# Patient Record
Sex: Male | Born: 2000 | Race: Black or African American | Hispanic: No | Marital: Single | Smoking: Never smoker
Health system: Southern US, Community
[De-identification: ages and names within clinical notes are randomized; demographics above are authoritative.]

---
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
## 2020-05-03 ENCOUNTER — Ambulatory Visit: Payer: Medicaid Other | Admitting: Neurology

## 2020-05-04 ENCOUNTER — Ambulatory Visit: Payer: Medicaid Other | Admitting: Neurology

## 2020-05-04 ENCOUNTER — Encounter: Payer: Self-pay | Admitting: Neurology

## 2020-05-04 ENCOUNTER — Telehealth: Payer: Self-pay | Admitting: *Deleted

## 2020-05-04 VITALS — BP 105/70 | HR 92 | Ht 70.0 in | Wt 143.0 lb

## 2020-05-04 DIAGNOSIS — N4889 Other specified disorders of penis: Secondary | ICD-10-CM | POA: Diagnosis not present

## 2020-05-04 DIAGNOSIS — R519 Headache, unspecified: Secondary | ICD-10-CM | POA: Insufficient documentation

## 2020-05-04 DIAGNOSIS — R200 Anesthesia of skin: Secondary | ICD-10-CM | POA: Diagnosis not present

## 2020-05-04 NOTE — Progress Notes (Signed)
[facility]  PATIENT: Jeffrey Bates DOB: [birth year]  REFERRING DOCTOR OR PCP: LV Albertina Parr, FNP SOURCE: Patient, notes from primary care, urology and neurology, imaging reports, MRI images personally reviewed.  _________________________________   HISTORICAL  CHIEF COMPLAINT:  Chief Complaint  Patient presents with  . New Patient (Initial Visit)    RM 13, alone. Paper referral from [facility] for numbness and pain to left face,arm, and abdomenx51yr and intermittent penile pain w/ masturbation.    HISTORY OF PRESENT ILLNESS:  I had the pleasure of seeing patient, Jeffrey Bates, at [facility] for neurologic consultation regarding his left sided numbness, penile pain and other symptoms.  He is a 20 yo man who first noted penile pain and changes in the shape of his penis in early 2020.   This occurred when he felt a snap on the left side of his penis while masturbating.  He continued to experience pain afterwards.  Over the next couple months he began to note left penile and groin numbness..    Over time, he reports that he was experiencing more numbness over the whole left side from his face to his legs..   Also in 2020, he began to experience a sensation in his head like it was split.  Due to the numbness, he was referred to Dr. Everlena Cooper who ordered imaging studies that were normal..   He sees urology.  He was diagnosed with possible Peyronie's   He denies a curvature but feels his penis is not shaped right.      Besides the sensory symptoms, he feels his gait is worse.  He feels that he has trouble walking a straight line.    He feels weaker in general.   He reports a tearing apart sensation in his abdomen at times.  He feels he has more difficulty talking.  He feels his neck is stiff.   When I discussed the possibility of a psychogenic etiology, he denies depression, stress or anxiety.   Imaging was personally reviewed: MRI of the  brain 11/06/2018 was normal.  MRI of the lumbar spine 12/17/2018 showed very minimal disc bulging at L4-L5 and L5-S1 but no nerve root compression.  The conus medullaris appears normal.  MRIs of the cervical and thoracic spine 01/27/2019 were both normal.  REVIEW OF SYSTEMS: Constitutional: No fevers, chills, sweats, or change in appetite Eyes: No visual changes, double vision, eye pain Ear, nose and throat: No hearing loss, ear pain, nasal congestion, sore throat Cardiovascular: No chest pain, palpitations Respiratory: No shortness of breath at rest or with exertion.   No wheezes GastrointestinaI: No nausea, vomiting, diarrhea, abdominal pain, fecal incontinence Genitourinary: No dysuria, urinary retention or frequency.  No nocturia. Musculoskeletal: No neck pain, back pain Integumentary: No rash, pruritus, skin lesions Neurological: as above Psychiatric: No depression at this time.  No anxiety Endocrine: No palpitations, diaphoresis, change in appetite, change in weigh or increased thirst Hematologic/Lymphatic: No anemia, purpura, petechiae. Allergic/Immunologic: No itchy/runny eyes, nasal congestion, recent allergic reactions, rashes  ALLERGIES: No Known Allergies  HOME MEDICATIONS: No current outpatient medications on file.  PAST MEDICAL HISTORY: No past medical history on file.  PAST SURGICAL HISTORY: Past Surgical History:  Procedure Laterality Date  . NO PAST SURGERIES      FAMILY HISTORY: Family History  Problem Relation Age of Onset  . Arthritis Maternal Grandmother   . Asthma Maternal Grandfather     SOCIAL HISTORY:  Social History   Socioeconomic History  .  Marital status: Single    Spouse name: Not on file  . Number of children: 0  . Years of education: 82  . Highest education level: High school graduate  Occupational History  . Occupation: [employer]  Tobacco Use  . Smoking status: Never Smoker  . Smokeless tobacco: Never Used  Vaping Use  . Vaping  Use: Never used  Substance and Sexual Activity  . Alcohol use: Never  . Drug use: Yes    Types: Marijuana    Comment: 3 g per day   . Sexual activity: Not on file  Other Topics Concern  . Not on file  Social History Narrative   Right handed   One story, right handed   Lives with parent   12 years of schooling   Soda, Tea daily or less   Social Determinants of Corporate investment banker Strain: Not on file  Food Insecurity: Not on file  Transportation Needs: Not on file  Physical Activity: Not on file  Stress: Not on file  Social Connections: Not on file  Intimate Partner Violence: Not on file     PHYSICAL EXAM  Vitals:   05/04/20 1318  BP: 105/70  Pulse: 92  SpO2: 98%  Weight: 143 lb (64.9 kg)  Height: 5\' 10"  (1.778 m)    Body mass index is 20.52 kg/m.   General: The patient is well-developed and well-nourished and in no acute distress  HEENT:  Head is /AT.  Sclera are anicteric.    Neck: No carotid bruits are noted.  The neck is nontender.  Good range of motion  Cardiovascular: The heart has a regular rate and rhythm with a normal S1 and S2. There were no murmurs, gallops or rubs.    Skin: Extremities are without rash or  edema.  Musculoskeletal:  Back is nontender  Neurologic Exam  Mental status: The patient is alert and oriented x 3 at the time of the examination. The patient has apparent normal recent and remote memory, with an apparently normal attention span and concentration ability.   Speech is normal.  Cranial nerves: Extraocular movements are full.  Facial strength and sensation was normal.  No obvious hearing deficits are noted.  Motor:  Muscle bulk is normal.   Tone is normal. Strength is  5 / 5 in all 4 extremities.   Sensory: He reported reduced touch, temperature and vibration sensation in the entire left side of the body and groin.  Of note, this included functional findings such as vibration sensation over either side of the frontal  bone being asymmetric  Coordination: Cerebellar testing reveals good finger-nose-finger and heel-to-shin bilaterally.  Gait and station: Station is normal.   Gait is normal. Tandem gait is normal. Romberg is negative.   Reflexes: Deep tendon reflexes are 3 and symmetric.   Plantar responses are flexor.    DIAGNOSTIC DATA (LABS, IMAGING, TESTING) - I reviewed patient records, labs, notes, testing and imaging myself where available.  Lab Results  Component Value Date   WBC 8.5 09/17/2019   HGB 14.6 09/17/2019   HCT 44.7 09/17/2019   MCV 87.1 09/17/2019   PLT 281 09/17/2019      Component Value Date/Time   NA 139 09/17/2019 2121   K 3.5 09/17/2019 2121   CL 105 09/17/2019 2121   CO2 23 09/17/2019 2121   GLUCOSE 121 (H) 09/17/2019 2121   BUN 13 09/17/2019 2121   CREATININE 0.90 09/17/2019 2121   CALCIUM 9.3 09/17/2019 2121  GFRNONAA >60 09/17/2019 2121   GFRAA >60 09/17/2019 2121       ASSESSMENT AND PLAN  Left sided numbness  Penile pain  Pressure in head    In summary, Jeffrey Bates is a 20 year old man with multiple somatic complaints including penile pain, left-sided numbness, clumsiness and speech difficulties.  I have personally reviewed the MRI of the brain and spine.  All 4 MRIs are essentially normal and offer no explanation to her symptoms.  I believe that based on the nature of his complaints, normal evaluation to date and functional findings on the physical examination that he has a psychogenic etiology.  It is certainly possible that he has a mild Peyronie's but that could not explain symptoms outside of the groin.  I discussed with him that sometimes the body's reaction to stress is to turn a small symptom into a larger one.  I offered to refer him to psychiatry but he feels he has no anxiety, stress or depression.  We can check a nerve conduction/EMG study just to make sure that he does not have something like a carpal tunnel syndrome that might also be  contributing.  However, there is no unifying neurologic explanation and psychogenic etiology is far more likely.  A follow-up office visit was not scheduled.   If he wishes to proceed with the NCV/EMG study I will see him at that time.   Margaretha Mahan A. Epimenio Foot, MD, [facility] 05/04/2020, 2:23 PM Certified in Neurology, Clinical Neurophysiology, Sleep Medicine and Neuroimaging  [facility] [address], [address] [place], [place] [address] [phone]

## 2020-05-04 NOTE — Telephone Encounter (Signed)
Called pt. He did not sign DPR form. He will come back another day Mon-Thurs 8-5pm to sign. He is on his way to work right now. Advised I will place up front for him to sign. He verbalized understanding.

## 2020-05-09 ENCOUNTER — Other Ambulatory Visit: Payer: Self-pay

## 2020-05-09 ENCOUNTER — Encounter: Payer: Self-pay | Admitting: Physical Therapy

## 2020-05-09 ENCOUNTER — Ambulatory Visit: Payer: Medicaid Other | Attending: Urology | Admitting: Physical Therapy

## 2020-05-09 DIAGNOSIS — R102 Pelvic and perineal pain: Secondary | ICD-10-CM | POA: Diagnosis present

## 2020-05-09 DIAGNOSIS — R252 Cramp and spasm: Secondary | ICD-10-CM | POA: Diagnosis present

## 2020-05-09 DIAGNOSIS — M6281 Muscle weakness (generalized): Secondary | ICD-10-CM | POA: Insufficient documentation

## 2020-05-09 DIAGNOSIS — R278 Other lack of coordination: Secondary | ICD-10-CM | POA: Diagnosis present

## 2020-05-09 NOTE — Therapy (Signed)
[facility] [address] [place], [place], [address] Phone: [phone]   Fax:  [phone]  Physical Therapy Treatment  Patient Details  Name: Archit Leger MRN: [id number] Date of Birth: 21-Aug-2000 Referring Provider (PT): Dr. Kasandra Knudsen   Encounter Date: 05/09/2020   PT End of Session - 05/09/20 1512    Visit Number 1    Date for PT Re-Evaluation 08/01/20    Authorization Type UHC Medicaid under 21    PT Start Time 1415   came late   PT Stop Time 1445    PT Time Calculation (min) 30 min    Activity Tolerance Patient tolerated treatment well    Behavior During Therapy [facility] for tasks assessed/performed           History reviewed. No pertinent past medical history.  Past Surgical History:  Procedure Laterality Date  . NO PAST SURGERIES      There were no vitals filed for this visit.   Subjective Assessment - 05/09/20 1422    Subjective Patient reports his pain started the beginning of 2020. Patient reports the left side of body is numb. Patient has some trouble talking and lazy eye on the left. Patient has seen a neurologist and test were negative. When started had a stinging in the penis.    Patient Stated Goals reduce pain in the pelvic area.    Currently in Pain? Yes    Pain Score 7    high is 10/10   Pain Location Pelvis    Pain Orientation Mid    Pain Descriptors / Indicators Numbness;Sharp    Pain Type Chronic pain    Pain Radiating Towards numbness on the left side of the penis    Pain Onset 1 to 4 weeks ago    Pain Frequency Constant    Aggravating Factors  masturbating, smoking, sitting long period of time    Pain Relieving Factors stand up from sitting position              [facility] PT Assessment - 05/09/20 0001      Assessment   Medical Diagnosis N48.89 Diorder of penis Qt; R10.2 Pelvic/perineal pain    Referring Provider (PT) Dr. Kasandra Knudsen    Onset Date/Surgical Date --   2020    Prior Therapy none      Precautions   Precautions None      Restrictions   Weight Bearing Restrictions No      Balance Screen   Has the patient fallen in the past 6 months No    Has the patient had a decrease in activity level because of a fear of falling?  No    Is the patient reluctant to leave their home because of a fear of falling?  No      Home Tourist information centre manager residence      Prior Function   Level of Independence Independent    Vocation Requirements work at [employer]    Leisure skatboard      Cognition   Overall Cognitive Status Within Functional Limits for tasks assessed      Sensation   Light Touch --   decreased feeling on the left side of gluteal, pelvic floor, hamstring     Posture/Postural Control   Posture/Postural Control No significant limitations      ROM / Strength   AROM / PROM / Strength AROM;PROM;Strength      AROM   Overall AROM Comments  full lumbar ROM      Strength   Left Hip Extension 4/5    Left Hip ABduction 4/5      Palpation   Palpation comment pain in left medial hamstring, along the ischiococcygeus on the left                                 PT Education - 05/09/20 1638    Education Details educated patient on how to massage the ischicavernosus    Person(s) Educated Patient    Methods Explanation;Demonstration    Comprehension Verbalized understanding;Returned demonstration            PT Short Term Goals - 05/09/20 1649      PT SHORT TERM GOAL #1   Title independent with initial HEP    Baseline Not educated yet    Period Weeks    Status New    Target Date 06/06/20             PT Long Term Goals - 05/09/20 1650      PT LONG TERM GOAL #1   Title independent with advanced HEP    Baseline not educated yet    Time 12    Period Weeks    Status New    Target Date 08/01/20      PT LONG TERM GOAL #2   Title penile pain with sitting decreased </= 2/10 due to improve tissue  mobility    Time 12    Period Weeks    Status New    Target Date 08/01/20      PT LONG TERM GOAL #3   Title penile pain with masturbation decreased >/= 2/10 compared to 8/10 due to improved bloodflow to the muscles    Time 12    Period Weeks    Status New    Target Date 08/01/20                 Plan - 05/09/20 1639    Clinical Impression Statement Patient is a 20 year old male with penile pain since 2022. Patient pain came on suddenly. He does skateboarding and will fall many times when doing tricks on his board. Patient reports decreased feeling on the left side of his penis and left buttocks. He has not difficulty with urination. He will get pain when he mastrubates. Left hip abduction and extension is 4/5. Pelvis in correct alignment. Palpable tenderness located on the left side of the ischiocavernosus. Patient was only comfortable with therapist palpating the pelvic floor through his clothes. Patient has mentioned other symptoms that may not be related to his pelvic floor including difficulty with speech, lazy left eye, twitching in left leg, Patient has seen a neurolgist but tests were negative. Patient will benefit from skilled therapy to improve penile sensation, reduce pain, and improve function.    Examination-Activity Limitations Sit    Examination-Participation Restrictions Community Activity    Stability/Clinical Decision Making Stable/Uncomplicated    Clinical Decision Making Low    Rehab Potential Good    PT Frequency 1x / week    PT Duration 12 weeks    PT Treatment/Interventions Biofeedback;Therapeutic activities;Therapeutic exercise;Neuromuscular re-education;Manual techniques;Dry needling    PT Next Visit Plan manual work to the pelvic floor; rule out pudendal neuralgia; hip stretches; diaphragmatic breathing    Consulted and Agree with Plan of Care Patient           Patient  will benefit from skilled therapeutic intervention in order to improve the following  deficits and impairments:  Increased fascial restricitons,Pain,Decreased endurance,Decreased strength,Decreased coordination  Visit Diagnosis: Muscle weakness (generalized) - Plan: PT plan of care cert/re-cert  Other lack of coordination - Plan: PT plan of care cert/re-cert  Pelvic pain - Plan: PT plan of care cert/re-cert  Cramp and spasm - Plan: PT plan of care cert/re-cert     Problem List Patient Active Problem List   Diagnosis Date Noted  . Left sided numbness 05/04/2020  . Pressure in head 05/04/2020  . Other constipation 06/09/2018  . Penile pain 04/07/2018    Eulis Foster, PT 05/09/20 4:55 PM   [facility] [address] [place], [place], [address] Phone: [phone]   Fax:  [phone]  Name: Triston Skare MRN: [id number] Date of Birth: January 02, 2001

## 2020-05-27 ENCOUNTER — Other Ambulatory Visit: Payer: Self-pay

## 2020-05-27 ENCOUNTER — Ambulatory Visit: Payer: Medicaid Other | Admitting: Physical Therapy

## 2020-05-27 ENCOUNTER — Encounter: Payer: Self-pay | Admitting: Physical Therapy

## 2020-05-27 DIAGNOSIS — R252 Cramp and spasm: Secondary | ICD-10-CM

## 2020-05-27 DIAGNOSIS — M6281 Muscle weakness (generalized): Secondary | ICD-10-CM | POA: Diagnosis not present

## 2020-05-27 DIAGNOSIS — R278 Other lack of coordination: Secondary | ICD-10-CM

## 2020-05-27 DIAGNOSIS — R102 Pelvic and perineal pain: Secondary | ICD-10-CM

## 2020-05-27 NOTE — Therapy (Signed)
[facility] [address], [place], [place], [address] Phone: [phone]   Fax:  [phone]  Physical Therapy Treatment  Patient Details  Name: Jeffrey Bates MRN: [id number] Date of Birth: 17-Apr-2000 Referring Provider (PT): Dr. Jacalyn Lefevre   Encounter Date: 05/27/2020   PT End of Session - 05/27/20 0906    Visit Number 2    Date for PT Re-Evaluation 08/01/20    Authorization Type UHC Medicaid under 21    Authorization Time Period 4/5-6/27    Authorization - Visit Number 2    Authorization - Number of Visits 12    PT Start Time 0906   came late   PT Stop Time 0930    PT Time Calculation (min) 24 min    Activity Tolerance Patient tolerated treatment well    Behavior During Therapy [facility] for tasks assessed/performed           History reviewed. No pertinent past medical history.  Past Surgical History:  Procedure Laterality Date  . NO PAST SURGERIES      There were no vitals filed for this visit.   Subjective Assessment - 05/27/20 0907    Subjective No changes since last visit.    Patient Stated Goals reduce pain in the pelvic area.    Currently in Pain? Yes    Pain Score 7     Pain Location Pelvis    Pain Orientation Mid    Pain Descriptors / Indicators Numbness;Sharp    Pain Type Chronic pain    Pain Radiating Towards numbness on the left side of the penis    Pain Onset 1 to 4 weeks ago    Pain Frequency Constant    Aggravating Factors  maturbating, smoking in sitting or standing position, sitting long period of time    Pain Relieving Factors stand up from sitting position    Multiple Pain Sites No                             [facility] Adult PT Treatment/Exercise - 05/27/20 0001      Lumbar Exercises: Stretches   Active Hamstring Stretch Right;Left;1 rep;30 seconds    Active Hamstring Stretch Limitations supine and sitting    Single Knee to Chest Stretch Right;Left;1 rep;30  seconds    Press Ups 2 reps;10 seconds    Piriformis Stretch Right;Left;1 rep    Piriformis Stretch Limitations sitting    Other Lumbar Stretch Exercise sitting hip adductor stretch holding 30 sec. each leg; happy baby stretch holding 1 min; unable to do a deep squat due to pain in legs and being very tight    Other Lumbar Stretch Exercise sitting on tennis ball and moving it back and forth to massage the pelvic floor muscles                  PT Education - 05/27/20 [place]    Education Details Access Code: [email or web address]    Person(s) Educated Patient    Methods Explanation;Demonstration;Verbal cues;Handout    Comprehension Verbalized understanding;Returned demonstration            PT Short Term Goals - 05/09/20 1649      PT SHORT TERM GOAL #1   Title independent with initial HEP    Baseline Not educated yet    Period Weeks    Status New    Target Date 06/06/20  PT Long Term Goals - 05/09/20 1650      PT LONG TERM GOAL #1   Title independent with advanced HEP    Baseline not educated yet    Time 12    Period Weeks    Status New    Target Date 08/01/20      PT LONG TERM GOAL #2   Title penile pain with sitting decreased </= 2/10 due to improve tissue mobility    Time 12    Period Weeks    Status New    Target Date 08/01/20      PT LONG TERM GOAL #3   Title penile pain with masturbation decreased >/= 2/10 compared to 8/10 due to improved bloodflow to the muscles    Time 12    Period Weeks    Status New    Target Date 08/01/20                 Plan - 05/27/20 0910    Clinical Impression Statement Patient understands to be on time for therapy. Patient is very tight and had difficulty with the stretches. Patient is not able to do squat stretch due to pain in legs. Patient has abdominal pain with supine exercises so he is doing them in sitting. Patient has not met goals due to just starting therapy. Patient will benefit from skilled therapy to  improve penile snesation, reduce pain , and improve function.    Examination-Activity Limitations Sit    Examination-Participation Restrictions Community Activity    Stability/Clinical Decision Making Stable/Uncomplicated    Rehab Potential Good    PT Frequency 1x / week    PT Duration 12 weeks    PT Treatment/Interventions Biofeedback;Therapeutic activities;Therapeutic exercise;Neuromuscular re-education;Manual techniques;Dry needling    PT Next Visit Plan manual work to the pelvic floor; rule out pudendal neuralgia; diaphragmatic breathing; abdominal work    PT Home Exercise Plan Access Code: [email or web address]    Recommended Other Services MD signed initia leval    Consulted and Agree with Plan of Care Patient           Patient will benefit from skilled therapeutic intervention in order to improve the following deficits and impairments:  Increased fascial restricitons,Pain,Decreased endurance,Decreased strength,Decreased coordination  Visit Diagnosis: Muscle weakness (generalized)  Other lack of coordination  Pelvic pain  Cramp and spasm     Problem List Patient Active Problem List   Diagnosis Date Noted  . Left sided numbness 05/04/2020  . Pressure in head 05/04/2020  . Other constipation 06/09/2018  . Penile pain 04/07/2018    [name], PT 05/27/20 9:35 AM    [facility] [address] [place], [place], [address] Phone: [phone]   Fax:  [phone]  Name: Jeffrey Bates MRN: [id number] Date of Birth: 10-Dec-2000

## 2020-05-27 NOTE — Patient Instructions (Signed)
Access Code: [email or web address] URL: [email or web address] Date: 05/27/2020 Prepared by: Eulis Foster  Exercises Supine Single Knee to Chest Stretch - 1 x daily - 7 x weekly - 1 sets - 2 reps - 30 sec hold Seated Piriformis Stretch with Trunk Bend - 1 x daily - 7 x weekly - 1 sets - 2 reps - 30 sec hold Seated Hamstring Stretch - 1 x daily - 7 x weekly - 1 sets - 2 reps - 30 sec hold Seated Hip Adductor Stretch - 1 x daily - 7 x weekly - 1 sets - 2 reps - 30 sec hold Prone Press Up - 1 x daily - 7 x weekly - 1 sets - 2 reps - 10 sec hold Supine Pelvic Floor Stretch - 1 x daily - 7 x weekly - 1 sets - 1 reps - `1 min hold [facility] [address] [place], [place] [address] Phone # [phone] Fax [phone]

## 2020-06-24 ENCOUNTER — Other Ambulatory Visit: Payer: Self-pay

## 2020-06-24 ENCOUNTER — Encounter: Payer: Self-pay | Admitting: Physical Therapy

## 2020-06-24 ENCOUNTER — Ambulatory Visit: Payer: Medicaid Other | Attending: Urology | Admitting: Physical Therapy

## 2020-06-24 DIAGNOSIS — R252 Cramp and spasm: Secondary | ICD-10-CM | POA: Insufficient documentation

## 2020-06-24 DIAGNOSIS — R102 Pelvic and perineal pain: Secondary | ICD-10-CM | POA: Insufficient documentation

## 2020-06-24 DIAGNOSIS — M6281 Muscle weakness (generalized): Secondary | ICD-10-CM | POA: Diagnosis not present

## 2020-06-24 DIAGNOSIS — R278 Other lack of coordination: Secondary | ICD-10-CM | POA: Insufficient documentation

## 2020-06-24 NOTE — Therapy (Signed)
[facility] [address], [address] [place], [place], [address] Phone: [phone]   Fax:  [phone]  Physical Therapy Treatment  Patient Details  Name: Jeffrey Bates MRN: [id number] Date of Birth: 18-Oct-2000 Referring Provider (PT): Dr. Kasandra Knudsen   Encounter Date: 06/24/2020   PT End of Session - 06/24/20 1153    Visit Number 3    Date for PT Re-Evaluation 08/01/20    Authorization Type U[employer] Medicaid under 21    Authorization Time Period 4/5-6/27    Authorization - Visit Number 3    Authorization - Number of Visits 12    PT Start Time 1100    PT Stop Time 1145    PT Time Calculation (min) 45 min    Activity Tolerance Patient tolerated treatment well    Behavior During Therapy [facility] for tasks assessed/performed           History reviewed. No pertinent past medical history.  Past Surgical History:  Procedure Laterality Date  . NO PAST SURGERIES      There were no vitals filed for this visit.   Subjective Assessment - 06/24/20 1106    Subjective No changes since last visit.    Patient Stated Goals reduce pain in the pelvic area.    Currently in Pain? Yes    Pain Score 7     Pain Location Pelvis    Pain Orientation Mid    Pain Descriptors / Indicators Numbness;Sharp    Pain Type Chronic pain    Pain Radiating Towards numbness on the left side of the penis    Pain Onset 1 to 4 weeks ago    Pain Frequency Constant    Aggravating Factors  maturbating, sitting or standing positon for long periods of tme    Pain Relieving Factors stand up from sitting position    Multiple Pain Sites No                             [facility] Adult PT Treatment/Exercise - 06/24/20 0001      Self-Care   Self-Care Other Self-Care Comments    Other Self-Care Comments  educated patient about the pelvic floor, where the muscles are, how they function      Manual Therapy   Manual Therapy Soft tissue mobilization     Soft tissue mobilization maual work to the left hip adductors. hamstring, coccygeus, levator ani, ischiocavernosus, wortking through trigger points and monitoring for pain.                  PT Education - 06/24/20 1152    Education Details educated pateint on the pelvic floor muscles and how they work; you tube video for perineal massage    Person(s) Educated Patient    Methods Explanation    Comprehension Verbalized understanding            PT Short Term Goals - 06/24/20 1155      PT SHORT TERM GOAL #1   Title independent with initial HEP    Time 4    Period Weeks    Status Achieved             PT Long Term Goals - 06/24/20 1155      PT LONG TERM GOAL #1   Title independent with advanced HEP    Time 12    Period Weeks    Status On-going      PT  LONG TERM GOAL #2   Title penile pain with sitting decreased </= 2/10 due to improve tissue mobility    Time 12    Period Weeks    Status On-going      PT LONG TERM GOAL #3   Title penile pain with masturbation decreased >/= 2/10 compared to 8/10 due to improved bloodflow to the muscles    Time 12    Period Weeks    Status On-going                 Plan - 06/24/20 1153    Clinical Impression Statement Patient has many trigger points along the coccygeus, levator ani, ischiocavernosus and hip adductors. The trigger points will make the muscle jump. Patient has less pain after the manual work. He is doing his HEP regularly. Patient will benefit from skilled therapy to improve pain and reduce trigger points to improve quality of life.    Examination-Activity Limitations Sit    Examination-Participation Restrictions Community Activity    Stability/Clinical Decision Making Stable/Uncomplicated    Rehab Potential Good    PT Frequency 1x / week    PT Duration 12 weeks    PT Treatment/Interventions Biofeedback;Therapeutic activities;Therapeutic exercise;Neuromuscular re-education;Manual techniques;Dry needling     PT Next Visit Plan manual work to the left pelvic floor; see if he needs internal work, fascial release of the inner left leg    PT Home Exercise Plan Access Code: [email or web address]    Consulted and Agree with Plan of Care Patient           Patient will benefit from skilled therapeutic intervention in order to improve the following deficits and impairments:  Increased fascial restricitons,Pain,Decreased endurance,Decreased strength,Decreased coordination  Visit Diagnosis: Muscle weakness (generalized)  Other lack of coordination  Pelvic pain  Cramp and spasm     Problem List Patient Active Problem List   Diagnosis Date Noted  . Left sided numbness 05/04/2020  . Pressure in head 05/04/2020  . Other constipation 06/09/2018  . Penile pain 04/07/2018    Eulis Foster, PT 06/24/20 11:58 AM   [facility] [address] [place], [place], [address] Phone: [phone]   Fax:  [phone]  Name: Dariyon Urquilla MRN: [id number] Date of Birth: 12-19-2000

## 2020-06-24 NOTE — Patient Instructions (Signed)
Self treatment for pelvic pain  Connect PT    [facility] [address] [place], [place] [address] Phone # [phone] Fax [phone]

## 2020-06-29 ENCOUNTER — Encounter: Payer: Self-pay | Admitting: Physical Therapy

## 2020-06-29 ENCOUNTER — Ambulatory Visit: Payer: Medicaid Other | Admitting: Physical Therapy

## 2020-06-29 ENCOUNTER — Other Ambulatory Visit: Payer: Self-pay

## 2020-06-29 DIAGNOSIS — M6281 Muscle weakness (generalized): Secondary | ICD-10-CM

## 2020-06-29 DIAGNOSIS — R252 Cramp and spasm: Secondary | ICD-10-CM

## 2020-06-29 DIAGNOSIS — R278 Other lack of coordination: Secondary | ICD-10-CM

## 2020-06-29 DIAGNOSIS — R102 Pelvic and perineal pain: Secondary | ICD-10-CM

## 2020-06-29 NOTE — Patient Instructions (Addendum)

## 2020-06-29 NOTE — Therapy (Signed)
[facility] [address] [place], [place], [address] Phone: [phone]   Fax:  [phone]  Physical Therapy Treatment  Patient Details  Name: Jeffrey Bates MRN: [id number] Date of Birth: [birth year] Referring Provider (PT): Dr. Kasandra Knudsen   Encounter Date: 06/29/2020   PT End of Session - 06/29/20 1446    Visit Number 4    Date for PT Re-Evaluation 08/01/20    Authorization Type UHC Medicaid under 21    Authorization Time Period 4/5-6/27    Authorization - Visit Number 4    Authorization - Number of Visits 12    PT Start Time 1421   came late   PT Stop Time 1445    PT Time Calculation (min) 24 min    Activity Tolerance Patient tolerated treatment well    Behavior During Therapy [facility] for tasks assessed/performed           History reviewed. No pertinent past medical history.  Past Surgical History:  Procedure Laterality Date  . NO PAST SURGERIES      There were no vitals filed for this visit.   Subjective Assessment - 06/29/20 1422    Subjective The tissue work has helped some. I am not able to get into the muscles like I want to.    Patient Stated Goals reduce pain in the pelvic area.    Currently in Pain? Yes    Pain Score 6     Pain Orientation Mid    Pain Descriptors / Indicators Numbness;Sharp    Pain Type Chronic pain    Pain Radiating Towards numbness on the left side of the penis    Pain Onset 1 to 4 weeks ago    Pain Frequency Constant    Aggravating Factors  masturbating, sitting or standing position for long periods of time    Pain Relieving Factors stand up from a sitting position    Multiple Pain Sites No                             [facility] Adult PT Treatment/Exercise - 06/29/20 0001      Manual Therapy   Manual Therapy Soft tissue mobilization    Manual therapy comments to assess for dry needling    Soft tissue mobilization manual work to the hi padductors,  quadricep, hamstring and ischiocavernosus and perineal body on the left            Trigger Point Dry Needling - 06/29/20 0001    Consent Given? Yes    Education Handout Provided Yes    Muscles Treated Lower Quadrant Adductor longus/brevis/magnus;Rectus femoris;Vastus lateralis;Hamstring   left   Other Dry Needling left ischiocavernosus and perineal body    Adductor Response Twitch response elicited;Palpable increased muscle length    Rectus femoris Response Twitch response elicited;Palpable increased muscle length    Vastus lateralis Response Twitch response elicited;Palpable increased muscle length                PT Education - 06/29/20 1444    Education Details information on dry needling    Person(s) Educated Patient    Methods Explanation;Handout    Comprehension Verbalized understanding            PT Short Term Goals - 06/24/20 1155      PT SHORT TERM GOAL #1   Title independent with initial HEP    Time 4    Period Weeks  Status Achieved             PT Long Term Goals - 06/24/20 1155      PT LONG TERM GOAL #1   Title independent with advanced HEP    Time 12    Period Weeks    Status On-going      PT LONG TERM GOAL #2   Title penile pain with sitting decreased </= 2/10 due to improve tissue mobility    Time 12    Period Weeks    Status On-going      PT LONG TERM GOAL #3   Title penile pain with masturbation decreased >/= 2/10 compared to 8/10 due to improved bloodflow to the muscles    Time 12    Period Weeks    Status On-going                 Plan - 06/29/20 1500    Clinical Impression Statement Patient had muscle twitches with the dry needling to the hip adductors, hamstring, quads and perineal body. Patient reports he has increased softness. Patient has improved feeling in the penise. Patient is doing his soft tissue work at home. Patient is having less abdominal discomfort. Patient left leg continued to twitch after the dry  needling. Patient will benefit from skilled therapy to improve muscle tension and lengthening.    Examination-Activity Limitations Sit    Examination-Participation Restrictions Community Activity    Stability/Clinical Decision Making Stable/Uncomplicated    Rehab Potential Good    PT Frequency 1x / week    PT Duration 12 weeks    PT Treatment/Interventions Biofeedback;Therapeutic activities;Therapeutic exercise;Neuromuscular re-education;Manual techniques;Dry needling    PT Next Visit Plan manual work to the left pelvic floor; see if he needs internal work, fascial release of the inner left leg; dry needling to the left leg and pelvic floor    PT Home Exercise Plan Access Code: [email or web address]    Consulted and Agree with Plan of Care Patient           Patient will benefit from skilled therapeutic intervention in order to improve the following deficits and impairments:  Increased fascial restricitons,Pain,Decreased endurance,Decreased strength,Decreased coordination  Visit Diagnosis: Muscle weakness (generalized)  Other lack of coordination  Pelvic pain  Cramp and spasm     Problem List Patient Active Problem List   Diagnosis Date Noted  . Left sided numbness 05/04/2020  . Pressure in head 05/04/2020  . Other constipation 06/09/2018  . Penile pain 04/07/2018    Eulis Foster, PT 06/29/20 3:32 PM   [facility] [address], [address] [place], [place], [address] Phone: [phone]   Fax:  [phone]  Name: Jeffrey Bates MRN: [id number] Date of Birth: 2000-06-16

## 2020-07-01 ENCOUNTER — Encounter: Payer: Medicaid Other | Admitting: Physical Therapy

## 2020-07-15 ENCOUNTER — Ambulatory Visit: Payer: Medicaid Other | Attending: Urology | Admitting: Physical Therapy

## 2020-07-15 ENCOUNTER — Other Ambulatory Visit: Payer: Self-pay

## 2020-07-15 ENCOUNTER — Encounter: Payer: Self-pay | Admitting: Physical Therapy

## 2020-07-15 DIAGNOSIS — R252 Cramp and spasm: Secondary | ICD-10-CM

## 2020-07-15 DIAGNOSIS — R102 Pelvic and perineal pain: Secondary | ICD-10-CM | POA: Diagnosis present

## 2020-07-15 DIAGNOSIS — M6281 Muscle weakness (generalized): Secondary | ICD-10-CM

## 2020-07-15 DIAGNOSIS — R278 Other lack of coordination: Secondary | ICD-10-CM

## 2020-07-15 NOTE — Therapy (Signed)
[facility] [address] [place], [place], [address] Phone: [phone]   Fax:  [phone]  Physical Therapy Treatment  Patient Details  Name: Jeffrey Bates MRN: [id number] Date of Birth: [birth year] Referring Provider (PT): Dr. Kasandra Knudsen   Encounter Date: 07/15/2020   PT End of Session - 07/15/20 1147     Visit Number 5    Date for PT Re-Evaluation 08/01/20    Authorization Type UHC Medicaid under 21    Authorization Time Period 4/5-6/27    Authorization - Visit Number 5    Authorization - Number of Visits 12    PT Start Time 1100    PT Stop Time 1145    PT Time Calculation (min) 45 min    Activity Tolerance Patient tolerated treatment well;No increased pain    Behavior During Therapy [facility] for tasks assessed/performed             History reviewed. No pertinent past medical history.  Past Surgical History:  Procedure Laterality Date   NO PAST SURGERIES      There were no vitals filed for this visit.   Subjective Assessment - 07/15/20 1103     Subjective I feel better coming here but not getting the sensitivity back on the lower left abdomen and left side of penis.    Patient Stated Goals reduce pain in the pelvic area.    Currently in Pain? Yes    Pain Score 6     Pain Location Abdomen    Pain Orientation Mid;Left    Pain Descriptors / Indicators Aching    Pain Type Chronic pain    Pain Radiating Towards numbness on the left side of the penis    Pain Onset More than a month ago    Pain Frequency Constant    Aggravating Factors  masturbating, sitting  position for long periods of time    Pain Relieving Factors standing helps    Multiple Pain Sites No                [facility] PT Assessment - 07/15/20 0001       Sensation   Light Touch Impaired Detail (P)     Light Touch Impaired Details -- (P)    decreased sensation of the left abdominal region     Palpation   Palpation comment  palpate the left abdominal area the left thigh will jump, lower abdominal is tender (P)                            [facility] Adult PT Treatment/Exercise - 07/15/20 0001       Lumbar Exercises: Stretches   Active Hamstring Stretch Right;Left;1 rep;30 seconds    Active Hamstring Stretch Limitations supine with strap    ITB Stretch Right;Left;1 rep;30 seconds    ITB Stretch Limitations supine with strap      Manual Therapy   Manual Therapy Soft tissue mobilization;Myofascial release    Soft tissue mobilization prone to the left levator ani, ischiocavernosus, along the left medial ishial tuberosity and using hip interna and external rotation to stretch muscles with manual work    Myofascial Release release of the mid and lower abdominal tissue going through the restrictions and monitoring for pain                      PT Short Term Goals - 06/24/20 1155  PT SHORT TERM GOAL #1   Title independent with initial HEP    Time 4    Period Weeks    Status Achieved               PT Long Term Goals - 07/15/20 1151       PT LONG TERM GOAL #1   Title independent with advanced HEP    Time 12    Period Weeks    Status On-going      PT LONG TERM GOAL #2   Title penile pain with sitting decreased </= 2/10 due to improve tissue mobility    Time 12    Period Weeks    Status On-going      PT LONG TERM GOAL #3   Title penile pain with masturbation decreased >/= 2/10 compared to 8/10 due to improved bloodflow to the muscles    Time 12    Period Weeks    Status On-going                   Plan - 07/15/20 1148     Clinical Impression Statement Patient pain level decreases after therapy for 4 days. He still does not have sensitivity restored for the left lower abdominal, left side of the penis, and left inner thigh. As therapist is perfroming manual work the left leg keeps on twitching in the upper thigh. Patient has fascial restrictions in the  left lower abdominal with pain and left superior transverse perineum. Patient has tight hamstring. Patient does not have full sensation with masturbation. Patient will benefit from skilled therapy to improve muscles tension and lengthening.    Examination-Activity Limitations Sit    Examination-Participation Restrictions Community Activity    Stability/Clinical Decision Making Stable/Uncomplicated    Rehab Potential Good    PT Frequency 1x / week    PT Duration 12 weeks    PT Treatment/Interventions Biofeedback;Therapeutic activities;Therapeutic exercise;Neuromuscular re-education;Manual techniques;Dry needling    PT Next Visit Plan manual work to the left pelvic floor; see if he needs internal work, fascial release of the inner left leg; dry needling to the left leg, hamstring and pelvic floor for left levator ani and perinela body; need renewal and medicaid recert this visit to continue with treatments into July    PT Home Exercise Plan Access Code: [email or web address]    Consulted and Agree with Plan of Care Patient             Patient will benefit from skilled therapeutic intervention in order to improve the following deficits and impairments:  Increased fascial restricitons, Pain, Decreased endurance, Decreased strength, Decreased coordination  Visit Diagnosis: Muscle weakness (generalized)  Other lack of coordination  Pelvic pain  Cramp and spasm     Problem List Patient Active Problem List   Diagnosis Date Noted   Left sided numbness 05/04/2020   Pressure in head 05/04/2020   Other constipation 06/09/2018   Penile pain 04/07/2018    Eulis Foster, PT 07/15/20 11:54 AM   [facility] [address] [place], [place], [address] Phone: [phone]   Fax:  [phone]  Name: Jeffrey Bates MRN: [id number] Date of Birth: 2000/12/03

## 2020-07-29 ENCOUNTER — Other Ambulatory Visit: Payer: Self-pay

## 2020-07-29 ENCOUNTER — Ambulatory Visit: Payer: Medicaid Other | Admitting: Physical Therapy

## 2020-07-29 ENCOUNTER — Encounter: Payer: Self-pay | Admitting: Physical Therapy

## 2020-07-29 DIAGNOSIS — R252 Cramp and spasm: Secondary | ICD-10-CM

## 2020-07-29 DIAGNOSIS — M6281 Muscle weakness (generalized): Secondary | ICD-10-CM

## 2020-07-29 DIAGNOSIS — R102 Pelvic and perineal pain: Secondary | ICD-10-CM

## 2020-07-29 DIAGNOSIS — R278 Other lack of coordination: Secondary | ICD-10-CM

## 2020-07-29 NOTE — Therapy (Signed)
[facility] [address] [place], [place], [address] Phone: [phone]   Fax:  [phone]  Physical Therapy Treatment  Patient Details  Name: Jeffrey Bates MRN: [id number] Date of Birth: 04/09/2000 Referring Provider (PT): Dr. Kasandra Knudsen   Encounter Date: 07/29/2020   PT End of Session - 07/29/20 1201     Visit Number 6    Date for PT Re-Evaluation 09/26/20    Authorization Type UHC Medicaid under 21    Authorization Time Period 4/5-6/27    Authorization - Visit Number 6    Authorization - Number of Visits 12    PT Start Time 1125   came late   PT Stop Time 1145    PT Time Calculation (min) 20 min    Activity Tolerance Patient tolerated treatment well;No increased pain    Behavior During Therapy [facility] for tasks assessed/performed             History reviewed. No pertinent past medical history.  Past Surgical History:  Procedure Laterality Date   NO PAST SURGERIES      There were no vitals filed for this visit.   Subjective Assessment - 07/29/20 1122     Subjective NO change in the sensitivity of the left side of the penis.The MD will be doing an ultrasound of the abdomen.    Patient Stated Goals reduce pain in the pelvic area.                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 Kasandra Knudsen    Onset Date/Surgical Date --   2020   Prior Therapy none      Precautions   Precautions None      Restrictions   Weight Bearing Restrictions No      Prior Function   Level of Independence Independent    Vocation Requirements Works [employer]    Leisure skatboard      Cognition   Overall Cognitive Status Within Functional Limits for tasks assessed      AROM   Overall AROM Comments full lumbar ROM      Strength   Left Hip Extension 4/5    Left Hip ABduction  3+/5      Palpation   Palpation comment tenderness located on the                        Pelvic Floor Special Questions - 07/29/20 0001     Urinary Leakage No    Pelvic Floor Internal Exam palpated the left side of the penis and medial side of the left pubic rami and his left thigh would twitch                       PT Education - 07/29/20 1200     Education Details Access Code: [email or web address]    Person(s) Educated Patient    Methods Explanation;Demonstration;Handout    Comprehension Verbalized understanding;Returned demonstration              PT Short Term Goals - 06/24/20 1155       PT SHORT TERM GOAL #1   Title independent with initial HEP    Time 4    Period Weeks    Status Achieved  PT Long Term Goals - 07/29/20 1126       PT LONG TERM GOAL #1   Title independent with advanced HEP    Time 12    Period Weeks      PT LONG TERM GOAL #2   Title penile pain with sitting decreased </= 2/10 due to improve tissue mobility    Baseline pain level 8/10 but after 4-5/10    Time 12    Status On-going      PT LONG TERM GOAL #3   Title penile pain with masturbation decreased >/= 2/10 compared to 8/10 due to improved bloodflow to the muscles    Baseline left side of penis is 10/10 but reduces when in the hard state 6/10    Time 12    Period Weeks    Status On-going                   Plan - 07/29/20 1202     Clinical Impression Statement Patient feels better for 3 days after therapy. Patient reports he does not have the sensitivity of the left side of the penis back.  He has pain in the shaft of the penis with masturbation but will reduce once the penis is in the hard state. Patient has weakness in the left hip and is weaker from the initial evaluation. Patient continues to have fascial restrictions in the left lower abdominal with pain and left supreior transverse perineum. He has tight hamstrings. When therapist  palpates the left side of the penis and along the medial side ot the left pubic rami his left thigh will twitch. He appears to have reduced sensation along the inguinal nerve and genitofemoral nerve. Patient will be getting an ultrasound of the abdomen in the future. Patient will benefit from skilled therapy to improve muscle tension and lengthening and improve strength.    Examination-Activity Limitations Sit    Examination-Participation Restrictions Community Activity    Stability/Clinical Decision Making Stable/Uncomplicated    Rehab Potential Good    PT Frequency 1x / week    PT Treatment/Interventions Biofeedback;Therapeutic activities;Therapeutic exercise;Neuromuscular re-education;Manual techniques;Dry needling    PT Next Visit Plan manual work to the left pelvic floor; see if he needs internal work, fascial release of the inner left leg; dry needling to the left leg, hamstring and pelvic floor for left levator ani and perinela body; need renewal and medicaid recert this visit to continue with treatments into July    PT Home Exercise Plan Access Code: [email or web address]    Consulted and Agree with Plan of Care Patient             Patient will benefit from skilled therapeutic intervention in order to improve the following deficits and impairments:  Increased fascial restricitons, Pain, Decreased endurance, Decreased strength, Decreased coordination  Visit Diagnosis: Muscle weakness (generalized) - Plan: PT plan of care cert/re-cert  Other lack of coordination - Plan: PT plan of care cert/re-cert  Pelvic pain - Plan: PT plan of care cert/re-cert  Cramp and spasm - Plan: PT plan of care cert/re-cert     Problem List Patient Active Problem List   Diagnosis Date Noted   Left sided numbness 05/04/2020   Pressure in head 05/04/2020   Other constipation 06/09/2018   Penile pain 04/07/2018    Eulis Foster, PT 07/29/20 12:11 PM  [facility] [address], [address] [place], [place], [address] Phone: [phone]   Fax:  [phone]  Name:  Kinan Safley MRN: [id number] Date of Birth: 02/20/2000

## 2020-07-29 NOTE — Patient Instructions (Signed)
Access Code: [email or web address] URL: [email or web address] Date: 07/29/2020 Prepared by: Eulis Foster  Exercises Supine Single Knee to Chest Stretch - 1 x daily - 7 x weekly - 1 sets - 2 reps - 30 sec hold Seated Piriformis Stretch with Trunk Bend - 1 x daily - 7 x weekly - 1 sets - 2 reps - 30 sec hold Seated Hamstring Stretch - 1 x daily - 7 x weekly - 1 sets - 2 reps - 30 sec hold Seated Hip Adductor Stretch - 1 x daily - 7 x weekly - 1 sets - 2 reps - 30 sec hold Prone Press Up - 1 x daily - 7 x weekly - 1 sets - 2 reps - 10 sec hold Supine Pelvic Floor Stretch - 1 x daily - 7 x weekly - 1 sets - 1 reps - `1 min hold Prone Quadriceps Stretch with Strap - 3 x daily - 7 x weekly - 1 sets - 3 reps - 15 sec hold  [facility] [address] [place], [place] [address] Phone # [phone] Fax [phone]

## 2020-08-05 ENCOUNTER — Encounter: Payer: Self-pay | Admitting: Physical Therapy

## 2020-08-05 ENCOUNTER — Other Ambulatory Visit: Payer: Self-pay

## 2020-08-05 ENCOUNTER — Ambulatory Visit: Payer: Medicaid Other | Attending: Urology | Admitting: Physical Therapy

## 2020-08-05 DIAGNOSIS — R102 Pelvic and perineal pain: Secondary | ICD-10-CM | POA: Diagnosis present

## 2020-08-05 DIAGNOSIS — R278 Other lack of coordination: Secondary | ICD-10-CM

## 2020-08-05 DIAGNOSIS — R252 Cramp and spasm: Secondary | ICD-10-CM

## 2020-08-05 DIAGNOSIS — M6281 Muscle weakness (generalized): Secondary | ICD-10-CM | POA: Diagnosis not present

## 2020-08-05 NOTE — Therapy (Signed)
[facility] [address] [place], [place], [address] Phone: [phone]   Fax:  [phone]  Physical Therapy Treatment  Patient Details  Name: Jeffrey Bates MRN: [id number] Date of Birth: [birth year] Referring Provider (PT): Dr. Kasandra Knudsen   Encounter Date: 08/05/2020   PT End of Session - 08/05/20 1147     Visit Number 7    Date for PT Re-Evaluation 09/26/20    Authorization Type U[employer] Medicaid under 21    Authorization Time Period 4/5-6/27    Authorization - Visit Number 7    Authorization - Number of Visits 12    PT Start Time 1115   came late   PT Stop Time 1145    PT Time Calculation (min) 30 min    Activity Tolerance Patient tolerated treatment well;No increased pain    Behavior During Therapy [facility] for tasks assessed/performed             History reviewed. No pertinent past medical history.  Past Surgical History:  Procedure Laterality Date   NO PAST SURGERIES      There were no vitals filed for this visit.   Subjective Assessment - 08/05/20 1112     Subjective I have not had the ultrasound yet. The results does not last.    Patient Stated Goals reduce pain in the pelvic area.    Currently in Pain? Yes    Pain Score 6     Pain Location Abdomen    Pain Orientation Mid;Left    Pain Descriptors / Indicators Aching    Pain Type Chronic pain    Pain Radiating Towards numbness on the left side of the pelvis    Pain Onset More than a month ago    Pain Frequency Constant    Aggravating Factors  masturbating, sitting position for long periods of time    Pain Relieving Factors standing helps    Multiple Pain Sites No                [facility] PT Assessment - 08/05/20 0001       Palpation   Palpation comment tenderness located in the left abdominal area and when touched his body will flinch                           [facility] Adult PT Treatment/Exercise - 08/05/20 0001        Lumbar Exercises: Stretches   Active Hamstring Stretch Right;Left;1 rep;30 seconds    Active Hamstring Stretch Limitations supine with strap    Quadruped Mid Back Stretch 30 seconds;3 reps    Quadruped Mid Back Stretch Limitations childs pose    Other Lumbar Stretch Exercise sciatic nerve flossing with cervical flexion and extension    Other Lumbar Stretch Exercise lay on right side with cervical flexion, therapist moving the right leg into extension and abduction monitoring for pain 2 sets of 10; prone on elbows with cervical extension and knee flexion 6x and increases the pain in the left side of face, left abdominal and left leg                    PT Education - 08/05/20 1147     Education Details Access Code: [email or web address]    Person(s) Educated Patient    Methods Explanation;Demonstration;Verbal cues;Handout    Comprehension Returned demonstration;Verbalized understanding              PT Short Term  Goals - 06/24/20 1155       PT SHORT TERM GOAL #1   Title independent with initial HEP    Time 4    Period Weeks    Status Achieved               PT Long Term Goals - 07/29/20 1126       PT LONG TERM GOAL #1   Title independent with advanced HEP    Time 12    Period Weeks      PT LONG TERM GOAL #2   Title penile pain with sitting decreased </= 2/10 due to improve tissue mobility    Baseline pain level 8/10 but after 4-5/10    Time 12    Status On-going      PT LONG TERM GOAL #3   Title penile pain with masturbation decreased >/= 2/10 compared to 8/10 due to improved bloodflow to the muscles    Baseline left side of penis is 10/10 but reduces when in the hard state 6/10    Time 12    Period Weeks    Status On-going                   Plan - 08/05/20 1148     Clinical Impression Statement Patient has numbness on the left side of his body. He has tenderness located in the abdomen and when touched will make his body flinch. today in therapy we  worked on nerve flossing of the femoral and inguinal nerve. Prone on elbows has with cervical flexion and knee flexion increased the left face, left abdominal and left leg pain. Right sidely with head flexion with left leg abduction and knee flexion reduced his pain and he felt pulling in the left abdominal region. Patient has relief of pain several days after therapy but then the pain will return. Patient will benefit from skilled therapy to improve muscle tension and lengthening and improve strength.    Examination-Activity Limitations Sit    Examination-Participation Restrictions Community Activity    Stability/Clinical Decision Making Stable/Uncomplicated    Rehab Potential Good    PT Frequency 1x / week    PT Duration 12 weeks    PT Treatment/Interventions Biofeedback;Therapeutic activities;Therapeutic exercise;Neuromuscular re-education;Manual techniques;Dry needling    PT Next Visit Plan manual work to the left pelvic floor; see if he needs internal work, fascial release of the inner left leg; dry needling to the left leg, hamstring and pelvic floor for left levator ani and perinela body    PT Home Exercise Plan Access Code: [email or web address]    Consulted and Agree with Plan of Care Patient             Patient will benefit from skilled therapeutic intervention in order to improve the following deficits and impairments:  Increased fascial restricitons, Pain, Decreased endurance, Decreased strength, Decreased coordination  Visit Diagnosis: Muscle weakness (generalized)  Other lack of coordination  Pelvic pain  Cramp and spasm     Problem List Patient Active Problem List   Diagnosis Date Noted   Left sided numbness 05/04/2020   Pressure in head 05/04/2020   Other constipation 06/09/2018   Penile pain 04/07/2018    Eulis Foster, PT 08/05/20 11:56 AM  [facility] [address] [place], [place], [address] Phone:  [phone]   Fax:  [phone]  Name: Jeffrey Bates MRN: [id number] Date of Birth: 2000-07-16

## 2020-08-05 NOTE — Patient Instructions (Signed)
Access Code: [email or web address] URL: [email or web address] Date: 08/05/2020 Prepared by: Eulis Foster  Exercises Seated Piriformis Stretch with Trunk Bend - 1 x daily - 7 x weekly - 1 sets - 2 reps - 30 sec hold Seated Hip Adductor Stretch - 1 x daily - 7 x weekly - 1 sets - 2 reps - 30 sec hold Prone Press Up - 1 x daily - 7 x weekly - 1 sets - 2 reps - 10 sec hold Supine Pelvic Floor Stretch - 1 x daily - 7 x weekly - 1 sets - 1 reps - `1 min hold Prone Quadriceps Stretch with Strap - 3 x daily - 7 x weekly - 1 sets - 3 reps - 15 sec hold Child's Pose Stretch - 1 x daily - 7 x weekly - 1 sets - 2 reps - 30 sec hold Supine Hamstring Stretch with Strap - 1 x daily - 7 x weekly - 1 sets - 2 reps - 30 sec hold Seated Sciatic Tensioner - 1 x daily - 7 x weekly - 1 sets - 5 reps Prone Femoral Nerve Mobilization - 1 x daily - 7 x weekly - 1 sets - 5 reps [facility] [address], [address] [place], [place] [address] Phone # [phone] Fax [phone]

## 2020-08-12 ENCOUNTER — Encounter: Payer: Self-pay | Admitting: Physical Therapy

## 2020-08-12 ENCOUNTER — Ambulatory Visit: Payer: Medicaid Other | Admitting: Physical Therapy

## 2020-08-12 ENCOUNTER — Other Ambulatory Visit: Payer: Self-pay

## 2020-08-12 DIAGNOSIS — M6281 Muscle weakness (generalized): Secondary | ICD-10-CM

## 2020-08-12 DIAGNOSIS — R252 Cramp and spasm: Secondary | ICD-10-CM

## 2020-08-12 DIAGNOSIS — R102 Pelvic and perineal pain: Secondary | ICD-10-CM

## 2020-08-12 DIAGNOSIS — R278 Other lack of coordination: Secondary | ICD-10-CM

## 2020-08-12 NOTE — Therapy (Signed)
[facility] [address] [place], [place], [address] Phone: [phone]   Fax:  [phone]  Physical Therapy Treatment  Patient Details  Name: Jeffrey Bates MRN: [id number] Date of Birth: 01-Jul-2000 Referring Provider (PT): Dr. Jacalyn Lefevre   Encounter Date: 08/12/2020   PT End of Session - 08/12/20 1148     Visit Number 8    Date for PT Re-Evaluation 09/26/20    Authorization Type U[employer] Medicaid under 21    Authorization Time Period 4/5-6/27    PT Start Time 1107    PT Stop Time 1138    PT Time Calculation (min) 31 min    Activity Tolerance Patient tolerated treatment well;No increased pain    Behavior During Therapy [facility] for tasks assessed/performed             History reviewed. No pertinent past medical history.  Past Surgical History:  Procedure Laterality Date   NO PAST SURGERIES      There were no vitals filed for this visit.   Subjective Assessment - 08/12/20 1110     Subjective the home program is helping with pain and reduces the pain decreased to 6/10. It helps the abdomen and face. The sensitivity on the left side of the body has not come back.    Patient Stated Goals reduce pain in the pelvic area.    Currently in Pain? 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 Jacalyn Lefevre    Onset Date/Surgical Date --   2020   Prior Therapy none      Precautions    Precautions None      Restrictions   Weight Bearing Restrictions No      Prior Function   Level of Independence Independent    Vocation Requirements Works [employer]    Leisure skatboard      Cognition   Overall Cognitive Status Within Functional Limits for tasks assessed      AROM   Overall AROM Comments full lumbar ROM      Strength   Left Hip Extension 4/5    Left Hip ABduction 3+/5                           [facility] Adult PT Treatment/Exercise - 08/12/20 0001       Manual Therapy   Manual Therapy Soft tissue mobilization;Myofascial release    Manual therapy comments to assess for dry needling    Soft tissue mobilization manual mobilization to the left hip adductors, rectus and quadricpe to elongate after dry needling   during the manual work the patients left leg would twitch   Myofascial Release release of the abdominal fascial but stopped due to the pain in  the abdomen              Trigger Point Dry Needling - 08/12/20 0001     Consent Given? Yes    Education Handout Provided Previously provided    Muscles Treated Lower Quadrant Adductor longus/brevis/magnus;Quadriceps;Rectus femoris    Quadriceps Response Twitch response elicited;Palpable increased muscle length    Adductor Response Twitch response elicited;Palpable increased muscle length    Rectus femoris Response Twitch response elicited;Palpable increased muscle length    Vastus lateralis Response Twitch response elicited;Palpable increased muscle length                    PT Short Term Goals - 06/24/20 1155       PT SHORT TERM GOAL #1   Title independent with initial HEP    Time 4    Period Weeks    Status Achieved               PT Long Term Goals - 08/12/20 1153       PT LONG TERM GOAL #1   Title independent with advanced HEP    Time 12    Period Weeks    Status Achieved      PT LONG TERM GOAL #2   Title penile pain with sitting decreased </= 2/10 due to  improve tissue mobility    Time 12    Period Weeks    Status Not Met      PT LONG TERM GOAL #3   Title penile pain with masturbation decreased >/= 2/10 compared to 8/10 due to improved bloodflow to the muscles    Baseline left side of penis is 10/10 but reduces when in the hard state 6/10    Time 12    Period Weeks    Status Not Met                   Plan - 08/12/20 1149     Clinical Impression Statement Patient is not having significant changes that will last more than a day or 2. He has done the nerve stretches with some decreased symptoms on the left side of his body. Patient continues to have decreased sensation in the left side of his body, abdominal pain, and left leg will twitch when touching the leg, pelvic floor and abdomen. The dry needling did not elicit some twitching in the left leg. Patient is being discharged due to no significant progress.    Examination-Activity Limitations Sit    Examination-Participation Restrictions Community Activity    Stability/Clinical Decision Making Stable/Uncomplicated    Rehab Potential Good    PT Treatment/Interventions Biofeedback;Therapeutic activities;Therapeutic exercise;Neuromuscular re-education;Manual techniques;Dry needling    PT Next Visit Plan Discharge to HEP    PT Home Exercise Plan Access Code: [email or web address]    Consulted and Agree with Plan of Care Patient             Patient will benefit from skilled therapeutic intervention in order to improve the following deficits and impairments:  Increased fascial restricitons, Pain, Decreased endurance, Decreased strength, Decreased coordination  Visit Diagnosis: Muscle weakness (generalized)  Other lack of coordination  Pelvic pain  Cramp and spasm     Problem List Patient Active Problem List   Diagnosis Date Noted   Left sided numbness 05/04/2020   Pressure in head 05/04/2020   Other constipation 06/09/2018   Penile pain 04/07/2018    Hence Jeffrey Bates 08/12/2020,  11:54 AM  [facility] [address], [place], [place], [address] Phone: [phone]   Fax:  [phone]  Name: Jeffrey Bates MRN: [id number] Date of Birth: 03-Aug-2000  PHYSICAL THERAPY DISCHARGE SUMMARY  Visits from Start of Care: 8  Current functional level related to goals / functional outcomes: See above.    Remaining deficits: See above.    Education / Equipment: HEP   Patient agrees to discharge. Patient goals were not met. Patient is being discharged due to lack of progress.Thank you for the referral. [name], PT 08/12/20 11:55 AM

## 2020-08-17 ENCOUNTER — Ambulatory Visit (INDEPENDENT_AMBULATORY_CARE_PROVIDER_SITE_OTHER): Payer: Medicaid Other | Admitting: Neurology

## 2020-08-17 ENCOUNTER — Other Ambulatory Visit: Payer: Self-pay

## 2020-08-17 ENCOUNTER — Encounter: Payer: Medicaid Other | Admitting: Neurology

## 2020-08-17 ENCOUNTER — Encounter: Payer: Self-pay | Admitting: Neurology

## 2020-08-17 DIAGNOSIS — R202 Paresthesia of skin: Secondary | ICD-10-CM | POA: Diagnosis not present

## 2020-08-17 DIAGNOSIS — R200 Anesthesia of skin: Secondary | ICD-10-CM | POA: Diagnosis not present

## 2020-08-17 DIAGNOSIS — Z0289 Encounter for other administrative examinations: Secondary | ICD-10-CM

## 2020-08-17 NOTE — Progress Notes (Signed)
Full Name: Jeffrey Bates Gender: Male MRN #: [id number] Date of Birth: October 07, 2000    Visit Date: 08/17/2020 13:33 Age: 20 Years Examining Physician: Despina Arias, MD  Referring Physician: Despina Arias, MD    History: Jeffrey Bates is a 20 year old man who reports pain in the left abdomen and groin/penis.  Additionally, he has numbness that fluctuates in the left hand and the left leg.  Nerve conduction studies: The left median, ulnar, peroneal and tibial motor responses had normal distal latencies, amplitudes and conduction velocities.  Tibial and ulnar F-wave latencies were normal.  The left median, ulnar, sural and superficial peroneal sensory responses had normal peak latencies and amplitudes.  Electromyography: Needle EMG of selected muscles of the left leg and hip was performed.  Motor unit morphology and recruitment was normal in all of the muscles tested.  There was no abnormal spontaneous activity.  Impression: This is a normal NCV/EMG study.  There was no evidence of polyneuropathy, compressive neuropathy or radiculopathy.   Jeffrey Bates A. Epimenio Foot, MD, PhD, FAAN Certified in Neurology, Clinical Neurophysiology, Sleep Medicine, Pain Medicine and Neuroimaging Director, [facility]  [facility] [address], [address] [place], [place] [address] [phone]   Verbal informed consent was obtained from the patient, patient was informed of potential risk of procedure, including bruising, bleeding, hematoma formation, infection, muscle weakness, muscle pain, numbness, among others.        MNC    Nerve / Sites Muscle Latency Ref. Amplitude Ref. Rel Amp Segments Distance Velocity Ref. Area    ms ms mV mV %  cm m/s m/s mVms  L Median - APB     Wrist APB 3.4 ?4.4 11.9 ?4.0 100 Wrist - APB 7   49.1     Upper arm APB 7.4  12.3  104 Upper arm - Wrist 24 60 ?49 50.5  L Ulnar - ADM     Wrist ADM 2.8 ?3.3 11.7  ?6.0 100 Wrist - ADM 7   48.4     B.Elbow ADM 6.3  11.5  97.6 B.Elbow - Wrist 21 60 ?49 47.2     A.Elbow ADM 8.0  11.5  101 A.Elbow - B.Elbow 10 59 ?49 47.0  L Peroneal - EDB     Ankle EDB 4.6 ?6.5 6.6 ?2.0 100 Ankle - EDB 9   15.8     Fib head EDB 10.7  5.6  83.9 Fib head - Ankle 30 49 ?44 14.7     Pop fossa EDB 12.8  4.9  88.9 Pop fossa - Fib head 10 48 ?44 13.8         Pop fossa - Ankle      L Tibial - AH     Ankle AH 3.7 ?5.8 10.1 ?4.0 100 Ankle - AH 9   19.8     Pop fossa AH 13.0  7.3  72 Pop fossa - Ankle 43 46 ?41 15.6             SNC    Nerve / Sites Rec. Site Peak Lat Ref.  Amp Ref. Segments Distance Peak Diff Ref.    ms ms V V  cm ms ms  L Sural - Ankle (Calf)     Calf Ankle 3.3 ?4.4 45 ?6 Calf - Ankle 14    L Superficial peroneal - Ankle     Lat leg Ankle 3.5 ?4.4 11 ?6 Lat leg - Ankle 14  L Median, Ulnar - Transcarpal comparison     Median Palm Wrist 1.9 ?2.2 186 ?35 Median Palm - Wrist 8       Ulnar Palm Wrist 2.0 ?2.2 83 ?12 Ulnar Palm - Wrist 8          Median Palm - Ulnar Palm  -0.1 ?0.4  L Median - Orthodromic (Dig II, Mid palm)     Dig II Wrist 3.0 ?3.4 34 ?10 Dig II - Wrist 13    L Ulnar - Orthodromic, (Dig V, Mid palm)     Dig V Wrist 3.0 ?3.1 27 ?5 Dig V - Wrist 37                 F  Wave    Nerve F Lat Ref.   ms ms  L Tibial - AH 53.6 ?56.0  L Ulnar - ADM 27.1 ?32.0         EMG Summary Table    Spontaneous MUAP Recruitment  Muscle IA Fib PSW Fasc Other Amp Dur. Poly Pattern  L. Vastus medialis Normal None None None _______ Normal Normal Normal Normal  L. Gastrocnemius (Medial head) Normal None None None _______ Normal Normal Normal Normal  L. Abductor hallucis Normal None None None _______ Normal Normal Normal Normal  L. Tibialis anterior Normal None None None _______ Normal Normal Normal Normal  L. Ilipsoas Normal None None None _______ Normal Normal Normal Normal  L. Gluteus medius Normal None None None _______ Normal Normal Normal Normal

## 2020-11-11 ENCOUNTER — Other Ambulatory Visit: Payer: Self-pay | Admitting: Nurse Practitioner

## 2020-11-11 DIAGNOSIS — R109 Unspecified abdominal pain: Secondary | ICD-10-CM

## 2020-11-28 ENCOUNTER — Other Ambulatory Visit: Payer: Self-pay

## 2020-11-28 ENCOUNTER — Ambulatory Visit
Admission: RE | Admit: 2020-11-28 | Discharge: 2020-11-28 | Disposition: A | Discharge status: Home / Self Care | Payer: Medicaid Other | Source: Ambulatory Visit | Attending: Nurse Practitioner | Admitting: Nurse Practitioner

## 2020-11-28 DIAGNOSIS — R109 Unspecified abdominal pain: Secondary | ICD-10-CM

## 2020-11-28 MED ORDER — IOPAMIDOL (ISOVUE-300) INJECTION 61%
100.0000 mL | Freq: Once | INTRAVENOUS | Status: AC | PRN
  Administered 2020-11-28: 100 mL via INTRAVENOUS

## 2020-11-30 ENCOUNTER — Encounter: Payer: Self-pay | Admitting: Nurse Practitioner

## 2020-12-20 ENCOUNTER — Ambulatory Visit (INDEPENDENT_AMBULATORY_CARE_PROVIDER_SITE_OTHER): Payer: Medicaid Other | Admitting: Nurse Practitioner

## 2020-12-20 ENCOUNTER — Encounter: Payer: Self-pay | Admitting: Nurse Practitioner

## 2020-12-20 VITALS — BP 110/60 | HR 103 | Ht 71.0 in | Wt 154.0 lb

## 2020-12-20 DIAGNOSIS — R1032 Left lower quadrant pain: Secondary | ICD-10-CM | POA: Diagnosis not present

## 2020-12-20 NOTE — Progress Notes (Signed)
ASSESSMENT AND PLAN    # 20 yo male with a 2 year history of nearly constant LLQ discomfort without any relation to eating or bowel movements. Additionally gets a "tearing / ripping" sensation of LLQ with bending. Unremarkable CTAP w/ contrast. His discomfort doesn't seem to be GI related. His tenderness in LLQ is very superficial ( uncomfortable with very light palpation). Over the last couple of year he has been undergoing neurologic workup for generalized left sided numbness. MRI of brain and spine unremarkable. Nerve conduction study and EMG negative.  --No need for further GI workup of his LLQ discomfort at this point.  --Patient expressed his concern that all of his symptoms may be related to a stroke. Reassured him that he has been seen by 2 different Neurologist and there hasn't been any evidence for  a stroke.   # Episode of constipation with bleeding. Spontaneous resolution of both. No other history of bowel problems or rectal bleeding.  --Strongly advised him to return if he has recurrent constipation, any other bowel changes or rectal bleeding as this would warrant a workup    HISTORY OF PRESENT ILLNESS     Chief Complaint : left sided abdominal pain   Jeffrey Bates is a 20 y.o. male with no significant past medical history.   Patient is new to the practice, referred by PCP for evaluation of abdominal pain.     Patient gives a two year history of LLQ pain . He has been followed by Neurology the last couple of years for generalized left-sided numbness.  In 2020 he saw [facility] and had a normal MRI of brain, normal MRI of cervical, thoracic and lumbar spine ( except for minimal bulging in the lumbar spine without nerve compression). 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   Patient was seen at [facility] in March 2022.  He was still having pressure in his head, ongoing left sided numbness. Additionally he felt that he was having trouble walking and talking.  He  reported a tearing apart sensation in his abdomen. 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  He did undergo a nerve conduction study and EMG which were normal. He recently went through physical therapy for the numbness in left side but says therapy was of only temporary help. Several times he expressed concern for a stroke  He is also followed by [facility] for penile pain and at one point there was a question of Peyronie's. However, neither group found penile abnormality to explain any of his symptoms ( [facility] most recent note 10/25/20).   Jeffrey Bates gives a two year history of LLQ discomfort .  The discomfort is nearly constant. Spicy food may make it worse but he is not sure . Bending over causes a "ripping" like sensation in the LLQ. Also when he taps his LLQ with his fingers his whole body convulses. The discomfort is not relieved with defecation. A few months ago he was having problems with constipation but that resolved on it own.During that time he had some rectal bleeding but that also resolved. He has at least 3 BMs a week but cannot be more specific than that.  He is urinating okay.  Appetite is fine .  CTAP w/ contrast Oct 2022 for abdominal pain without acute findings.   He complains of shortness of breath over the last 1.5 years. He sometimes "runs out of breath" when talking.   PREVIOUS GI EVALUATIONS:  none    Past Surgical History:  Procedure Laterality Date   NO PAST SURGERIES     Family History  Problem Relation Age of Onset   Arthritis Maternal Grandmother    Asthma Maternal Grandfather    Social History   Tobacco Use   Smoking status: Never   Smokeless tobacco: Never  Vaping Use   Vaping Use: Never used  Substance Use Topics   Alcohol use: Yes    Comment: socially   Drug use: Yes    Types: Marijuana    Comment: 3 g per day    Current Outpatient Medications  Medication Sig  Dispense Refill   tadalafil (CIALIS) 5 MG tablet Take 5 mg by mouth every morning.     No current facility-administered medications for this visit.   No Known Allergies   Review of Systems: All systems reviewed and negative except where noted in HPI.    PHYSICAL EXAM :    Wt Readings from Last 3 Encounters:  12/20/20 154 lb (69.9 kg)  05/04/20 143 lb (64.9 kg) (30 %, Z= -0.51)*  09/03/19 144 lb (65.3 kg) (36 %, Z= -0.37)*   * Growth percentiles are based on CDC (Boys, 2-20 Years) data.    BP 110/60   Pulse (!) 103   Ht 5\' 11"  (1.803 m)   Wt 154 lb (69.9 kg)   BMI 21.48 kg/m  Constitutional:  Generally well appearing male in no acute distress. Psychiatric: Pleasant. Normal mood and affect. Behavior is normal. EENT: Pupils normal.  Conjunctivae are normal. No scleral icterus. Neck supple.  Cardiovascular: Normal rate, regular rhythm. No edema Pulmonary/chest: Effort normal and breath sounds normal. No wheezing, rales or rhonchi. Abdominal: Soft, nondistended, mild LLQ tenderness to very light palpation. He had mild, transient quivering of LLQ and bilateral lower extremities with palpation of LLQ.  Bowel sounds active throughout. There are no masses palpable. No hepatomegaly. Neurological: Alert and oriented to person place and time. Skin: Skin is warm and dry. No rashes noted.  , NP  12/20/2020, 11:28 AM  Cc:  Referring Provider 12/22/2020, FNP

## 2020-12-20 NOTE — Patient Instructions (Signed)
If you are age 20 or younger, your body mass index should be between 19-25. Your Body mass index is 21.48 kg/m. If this is out of the aformentioned range listed, please consider follow up with your Primary Care Provider.  ________________________________________________________  The [facility] providers would like to encourage you to use [facility] to communicate with providers for non-urgent requests or questions.  Due to long hold times on the telephone, sending your provider a message by [facility] may be a faster and more efficient way to get a response.  Please allow 48 business hours for a response.  Please remember that this is for non-urgent requests.  _______________________________________________________  Call the office and schedule a follow up if recurrent rectal bleeding or constipation occurs.  Thank you for entrusting me with your care and choosing [facility].  Willette Cluster, NP-C

## 2020-12-20 NOTE — Progress Notes (Signed)
Attending Physician's Attestation   I have reviewed the chart.   I agree with the Advanced Practitioner's note, impression, and recommendations with any updates as below. Any further recurrence of rectal bleeding or alteration of bowel habits, he should let us know, and then endoscopic evaluation can be performed.   Corliss Parish, MD [facility] Advanced Endoscopy Office # [phone]

## 2021-01-26 IMAGING — MR MR THORACIC SPINE W/O CM
4 of 6 series · 13 of 48 positions shown · non-contrast
Comparison: None.

CLINICAL DATA: Upper extremity numbness, constipation and saddle
anesthesia

EXAM:
MRI CERVICAL AND THORACIC SPINE WITHOUT CONTRAST
TECHNIQUE: Multiplanar and multiecho pulse sequences of the cervical spine, to
include the craniocervical junction and cervicothoracic junction,
and the thoracic spine, were obtained without intravenous contrast.

[Series 10: T2 · sagittal · 4.0mm · 0.39mm/px · 4 of 13 slices shown (1 of 3)]
[im 1/13]
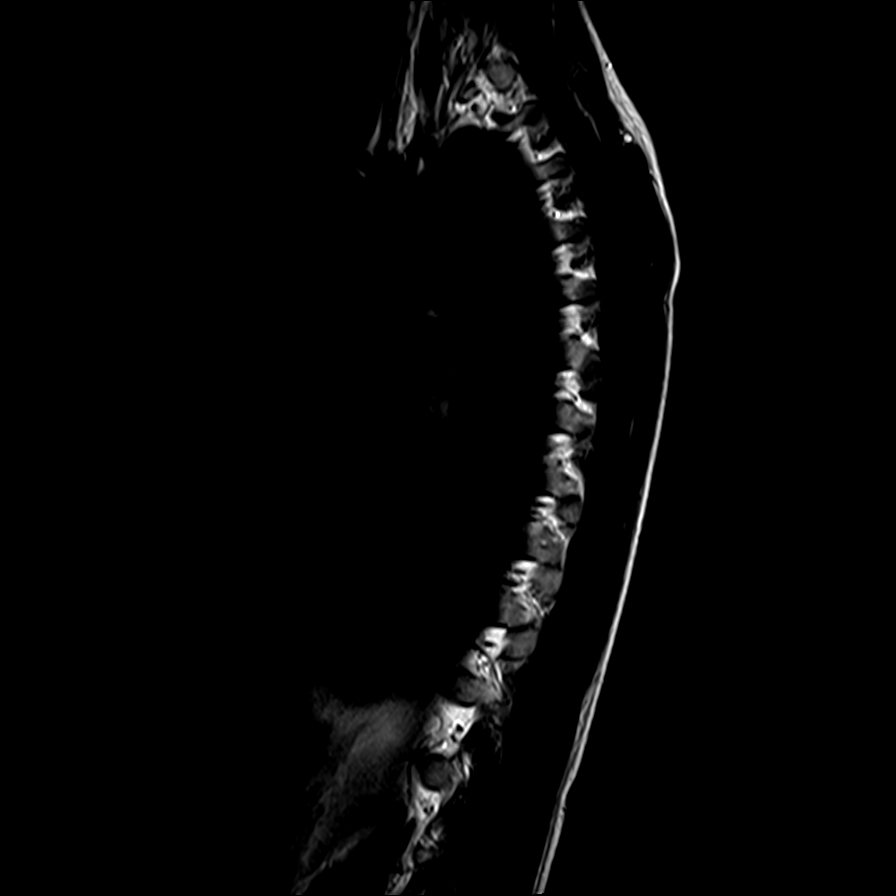
[im 4/13]
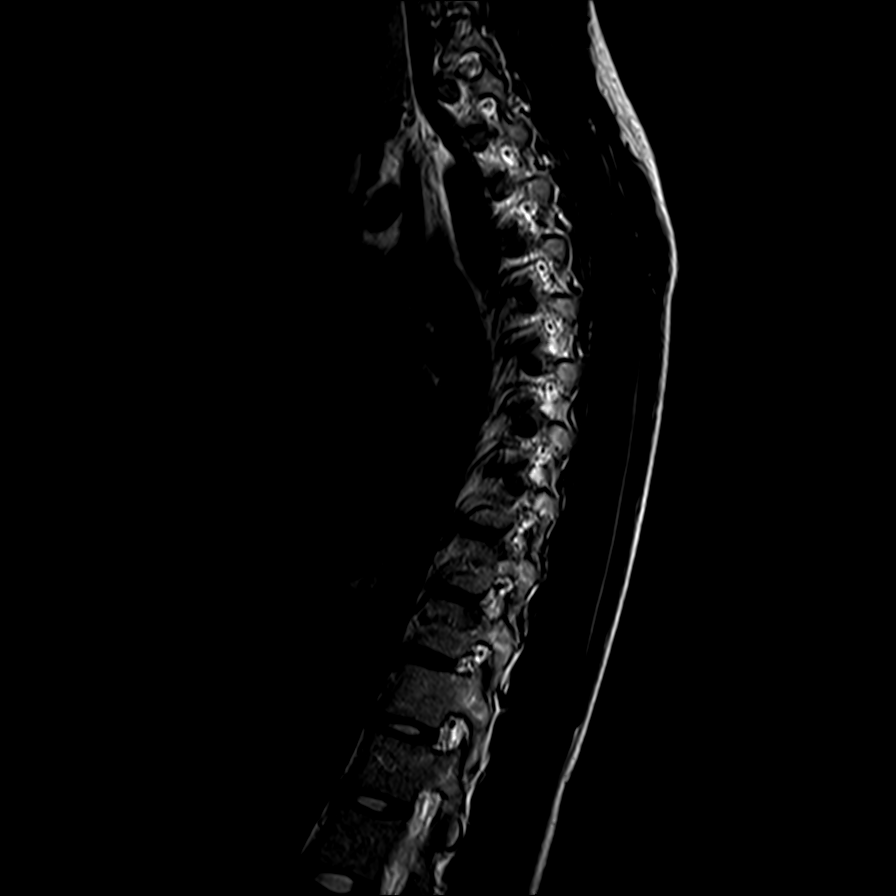
[im 7/13]
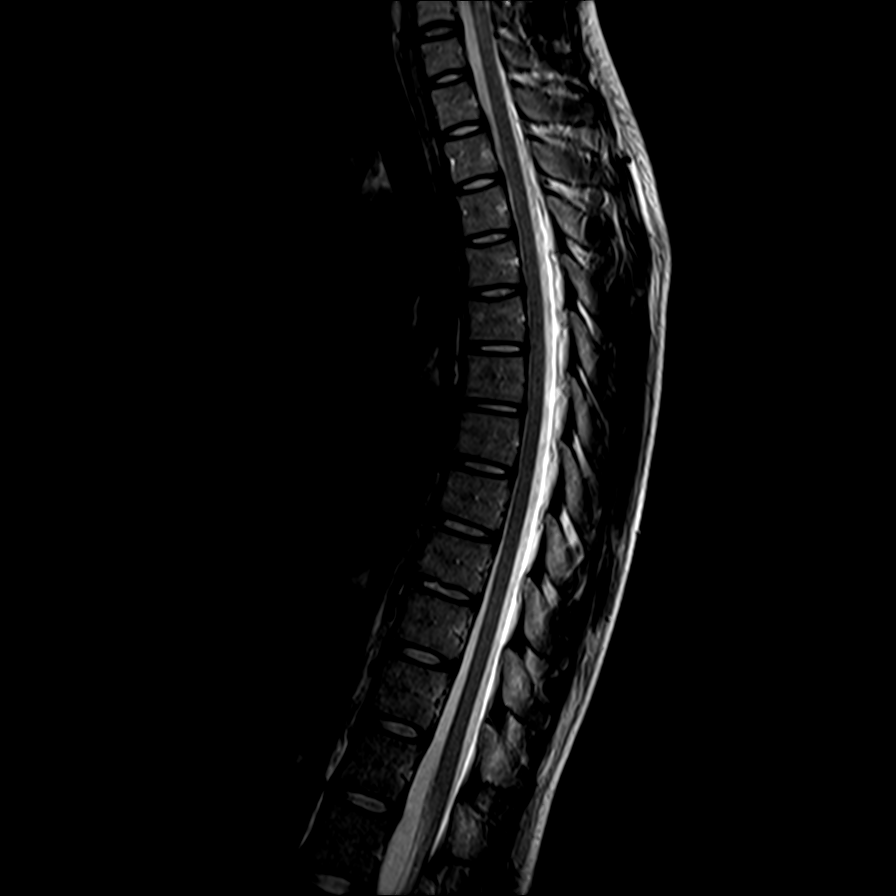
[im 13/13]
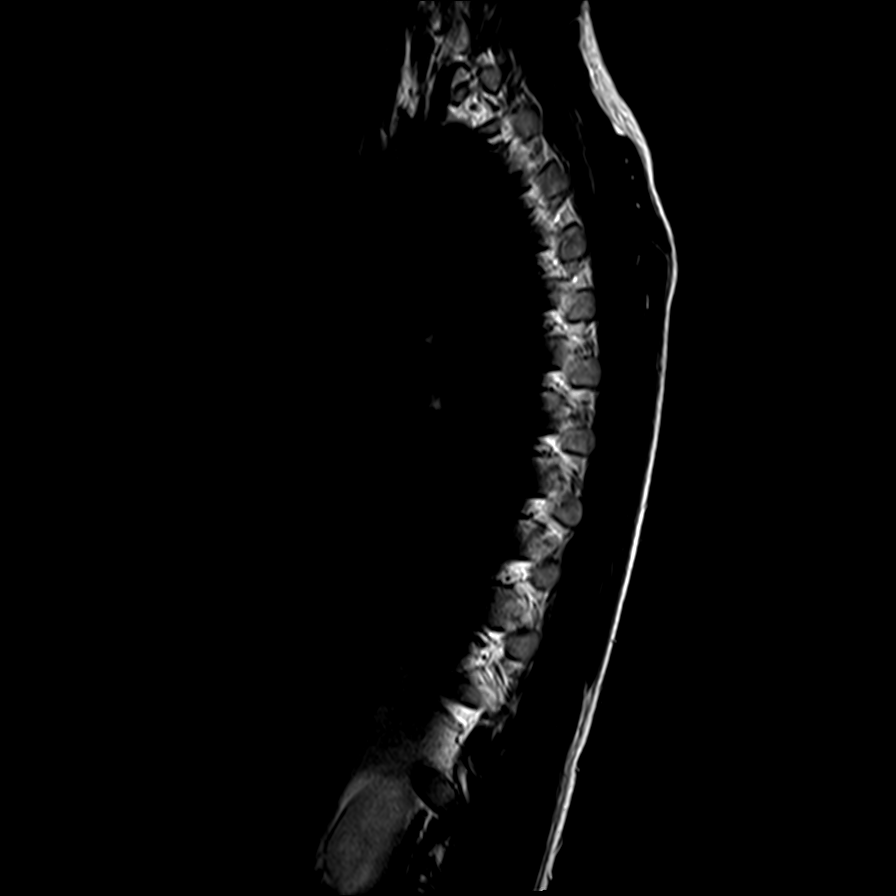

[Series 11: T1 · sagittal · 4.0mm · 0.78mm/px · 3 of 13 slices shown]
[im 1/13]
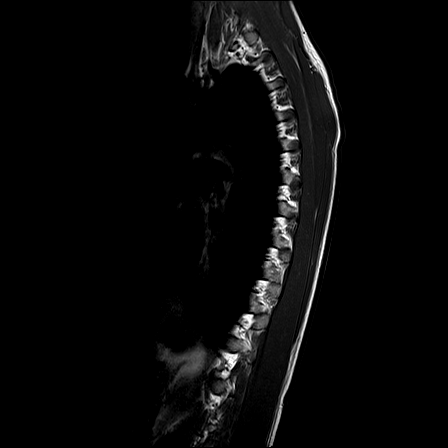
[im 7/13]
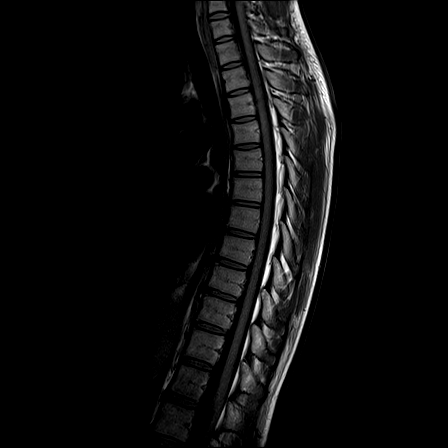
[im 13/13]
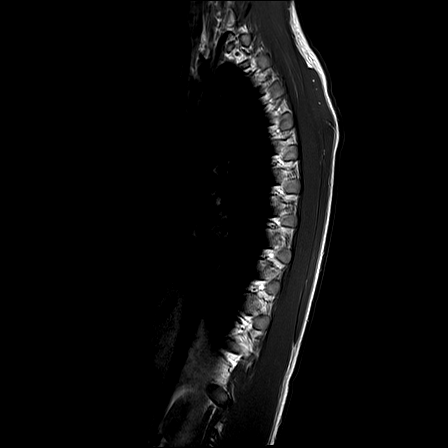

[Series 13: T2 · axial · 4.0mm · 0.39mm/px · z∈[-304,-133]mm · 3 of 39 slices shown (2 of 3)]
[im 6/39]
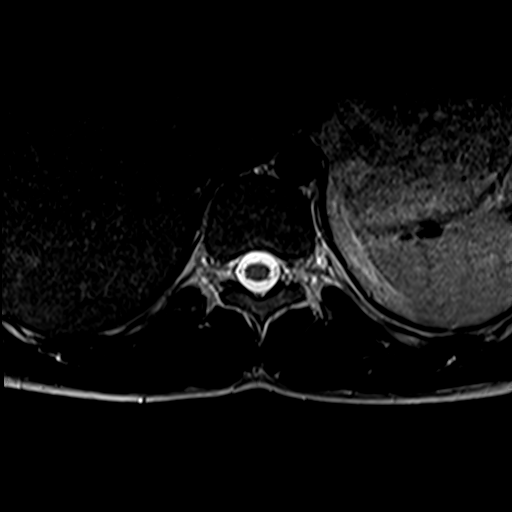
[im 20/39]
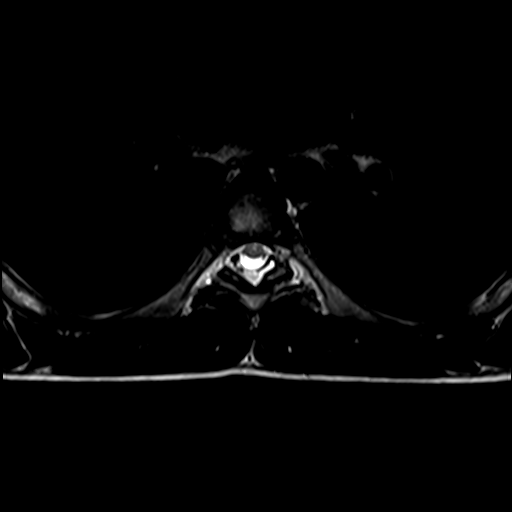
[im 33/39]
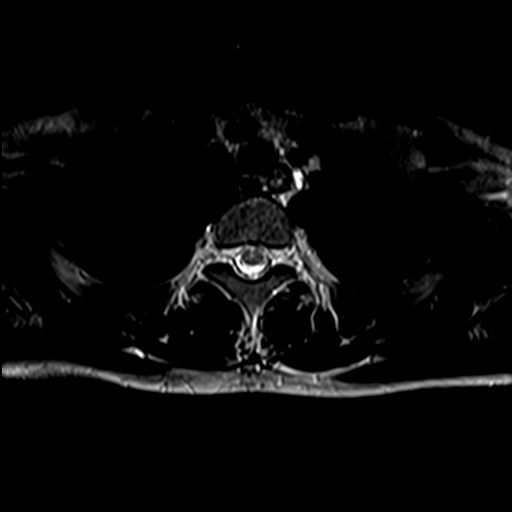

[Series 14: T2 · axial · 4.0mm · 0.39mm/px · z∈[-305,-129]mm · 3 of 39 slices shown (3 of 3)]
[im 6/39]
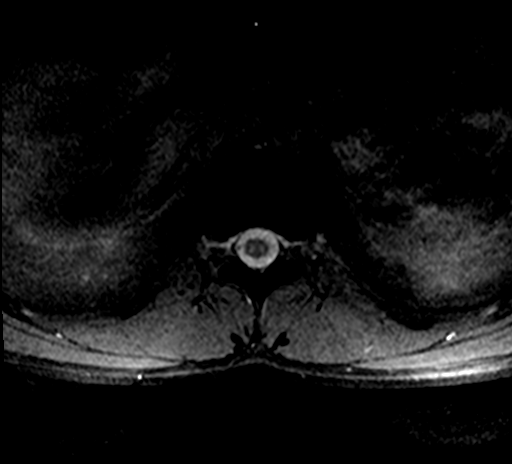
[im 20/39]
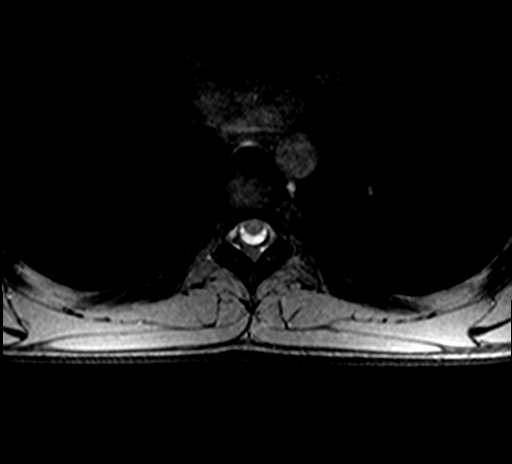
[im 33/39]
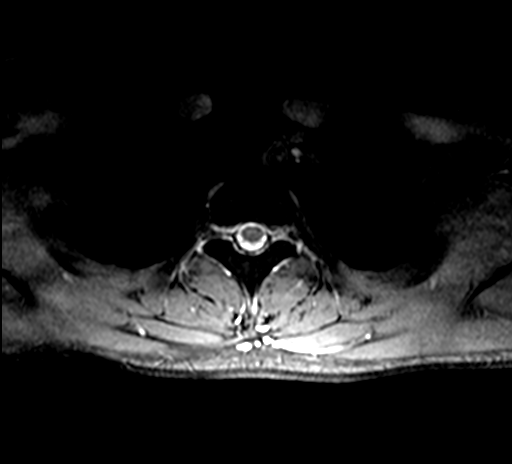

[13 of 48 positions shown; findings below may reference images not displayed]

FINDINGS: MRI CERVICAL SPINE FINDINGS

Alignment: Physiologic.

Vertebrae: No fracture, evidence of discitis, or bone lesion.

Cord: Normal signal and morphology.

Posterior Fossa, vertebral arteries, paraspinal tissues: Negative.

Disc levels:

No disc herniation, spinal canal stenosis or neural impingement.

MRI THORACIC SPINE FINDINGS

Alignment:  Physiologic.

Vertebrae: No fracture, evidence of discitis, or bone lesion.

Cord:  Normal signal and morphology.

Paraspinal and other soft tissues: Negative.

Disc levels:

No disc herniation, spinal canal stenosis or neural impingement.
IMPRESSION: Normal MRI of the thoracic and cervical spine.

## 2021-03-23 ENCOUNTER — Encounter: Payer: Self-pay | Admitting: Neurology

## 2021-03-23 NOTE — Telephone Encounter (Signed)
Error

## 2021-03-31 ENCOUNTER — Ambulatory Visit: Payer: Medicaid Other | Admitting: Neurology

## 2021-08-24 ENCOUNTER — Ambulatory Visit: Payer: Medicaid Other | Admitting: Neurology
